# Patient Record
Sex: Male | Born: 1945 | Race: Black or African American | Hispanic: No | Marital: Married | Smoking: Former smoker
Health system: Southern US, Community
[De-identification: ages and names within clinical notes are randomized; demographics above are authoritative.]

## PROBLEM LIST (undated history)

## (undated) DIAGNOSIS — E119 Type 2 diabetes mellitus without complications: Secondary | ICD-10-CM

## (undated) HISTORY — PX: HEMORRHOID SURGERY: SHX153

## (undated) HISTORY — PX: BACK SURGERY: SHX140

---

## 2005-09-15 ENCOUNTER — Emergency Department (HOSPITAL_COMMUNITY): Admission: EM | Admit: 2005-09-15 | Discharge: 2005-09-15 | Payer: Self-pay | Admitting: Emergency Medicine

## 2006-03-22 ENCOUNTER — Ambulatory Visit (HOSPITAL_BASED_OUTPATIENT_CLINIC_OR_DEPARTMENT_OTHER): Admission: RE | Admit: 2006-03-22 | Discharge: 2006-03-22 | Payer: Self-pay | Admitting: Urology

## 2006-04-15 ENCOUNTER — Emergency Department (HOSPITAL_COMMUNITY): Admission: EM | Admit: 2006-04-15 | Discharge: 2006-04-16 | Payer: Self-pay | Admitting: Emergency Medicine

## 2006-05-13 ENCOUNTER — Emergency Department (HOSPITAL_COMMUNITY): Admission: EM | Admit: 2006-05-13 | Discharge: 2006-05-13 | Payer: Self-pay | Admitting: Emergency Medicine

## 2006-06-23 ENCOUNTER — Encounter: Admission: RE | Admit: 2006-06-23 | Discharge: 2006-06-23 | Payer: Self-pay | Admitting: Orthopedic Surgery

## 2009-03-06 ENCOUNTER — Ambulatory Visit: Payer: Self-pay | Admitting: Urology

## 2009-06-09 ENCOUNTER — Emergency Department (HOSPITAL_COMMUNITY): Admission: EM | Admit: 2009-06-09 | Discharge: 2009-06-10 | Payer: Self-pay | Admitting: Emergency Medicine

## 2010-07-07 LAB — GLUCOSE, CAPILLARY: Glucose-Capillary: 306 mg/dL — ABNORMAL HIGH (ref 70–99)

## 2010-09-03 NOTE — Op Note (Signed)
NAME:  Joshua Zavala, Joshua Zavala               ACCOUNT NO.:  [id number]   MEDICAL RECORD NO.:  [id number]          PATIENT TYPE:  AMB   LOCATION:  [facility]                         FACILITY:  [facility]   PHYSICIAN:  Maretta Bees. Vonita Moss, M.D.DATE OF BIRTH:  22-Aug-1945   DATE OF PROCEDURE:  03/22/2006  DATE OF DISCHARGE:                               OPERATIVE REPORT   PREOPERATIVE DIAGNOSIS:  Phimosis.   POSTOPERATIVE DIAGNOSIS:  Phimosis.   PROCEDURE:  Circumcision.   SURGEON:  Maretta Bees. Vonita Moss, M.D.   ANESTHESIA:  General.   INDICATIONS FOR PROCEDURE:  This 65 year old diabetic has had  progressive problems with penile inflammation and now he cannot draw his  foreskin back and he has a thickened fibrotic phimosis.  He is brought  to the OR today for circumcision.   DESCRIPTION OF PROCEDURE:  The patient is brought to the operating room  and he elected general anesthesia.  He was prepped and draped in the  usual fashion.  A circumcision was performed using the sleeve technique.  Hemostasis was obtained with the use of electrocautery and 4-0 chromic  catgut ties.  The frenular area was closed with running 4-0 chromic  catgut and then the distal edge of the penile skin was attached to the  residual edge of the mucosal foreskin at 3, 6, 9 and 12 o'clock with 4-0  chromic catgut.  Running 4-0 chromic catgut was placed between these  quadrant sutures.  I should add before closure, 0.25% Marcaine was  injected for local analgesia.  The wound was then dressed with Vaseline  gauze, dry sterile gauze, and Coban.  Blood loss was minimal.  Sponge,  needle, and instrument counts were correct.  The patient tolerated the  procedure well and was taken to the recovery room in good condition.      Maretta Bees. Vonita Moss, M.D.  Electronically Signed     LJP/MEDQ  D:  03/22/2006  T:  03/22/2006  Job:  [id number]

## 2012-07-30 ENCOUNTER — Encounter (HOSPITAL_COMMUNITY): Payer: Self-pay | Admitting: *Deleted

## 2012-07-30 ENCOUNTER — Inpatient Hospital Stay (HOSPITAL_COMMUNITY)
Admission: EM | Admit: 2012-07-30 | Discharge: 2012-08-03 | DRG: 690 | Disposition: A | Discharge status: Home / Self Care | Payer: Medicare Other | Attending: Internal Medicine | Admitting: Internal Medicine

## 2012-07-30 DIAGNOSIS — A498 Other bacterial infections of unspecified site: Secondary | ICD-10-CM | POA: Diagnosis present

## 2012-07-30 DIAGNOSIS — Z79899 Other long term (current) drug therapy: Secondary | ICD-10-CM

## 2012-07-30 DIAGNOSIS — Z91199 Patient's noncompliance with other medical treatment and regimen due to unspecified reason: Secondary | ICD-10-CM

## 2012-07-30 DIAGNOSIS — M549 Dorsalgia, unspecified: Secondary | ICD-10-CM | POA: Diagnosis present

## 2012-07-30 DIAGNOSIS — E119 Type 2 diabetes mellitus without complications: Secondary | ICD-10-CM

## 2012-07-30 DIAGNOSIS — Z9119 Patient's noncompliance with other medical treatment and regimen: Secondary | ICD-10-CM

## 2012-07-30 DIAGNOSIS — L03039 Cellulitis of unspecified toe: Secondary | ICD-10-CM | POA: Diagnosis present

## 2012-07-30 DIAGNOSIS — E1165 Type 2 diabetes mellitus with hyperglycemia: Secondary | ICD-10-CM | POA: Diagnosis present

## 2012-07-30 DIAGNOSIS — R739 Hyperglycemia, unspecified: Secondary | ICD-10-CM

## 2012-07-30 DIAGNOSIS — Z87891 Personal history of nicotine dependence: Secondary | ICD-10-CM

## 2012-07-30 DIAGNOSIS — N12 Tubulo-interstitial nephritis, not specified as acute or chronic: Principal | ICD-10-CM | POA: Diagnosis present

## 2012-07-30 DIAGNOSIS — E871 Hypo-osmolality and hyponatremia: Secondary | ICD-10-CM

## 2012-07-30 DIAGNOSIS — G8929 Other chronic pain: Secondary | ICD-10-CM | POA: Diagnosis present

## 2012-07-30 DIAGNOSIS — L02619 Cutaneous abscess of unspecified foot: Secondary | ICD-10-CM | POA: Diagnosis present

## 2012-07-30 DIAGNOSIS — L97509 Non-pressure chronic ulcer of other part of unspecified foot with unspecified severity: Secondary | ICD-10-CM | POA: Diagnosis present

## 2012-07-30 DIAGNOSIS — E236 Other disorders of pituitary gland: Secondary | ICD-10-CM | POA: Diagnosis present

## 2012-07-30 DIAGNOSIS — IMO0002 Reserved for concepts with insufficient information to code with codable children: Secondary | ICD-10-CM | POA: Diagnosis present

## 2012-07-30 HISTORY — DX: Type 2 diabetes mellitus without complications: E11.9

## 2012-07-30 NOTE — ED Notes (Signed)
Pt c/o back pain since Friday; not sleeping; no appetite; frequent urination

## 2012-07-31 ENCOUNTER — Observation Stay (HOSPITAL_COMMUNITY): Payer: Medicare Other

## 2012-07-31 ENCOUNTER — Encounter (HOSPITAL_COMMUNITY): Payer: Self-pay | Admitting: Internal Medicine

## 2012-07-31 DIAGNOSIS — E119 Type 2 diabetes mellitus without complications: Secondary | ICD-10-CM

## 2012-07-31 DIAGNOSIS — E871 Hypo-osmolality and hyponatremia: Secondary | ICD-10-CM | POA: Diagnosis present

## 2012-07-31 DIAGNOSIS — N12 Tubulo-interstitial nephritis, not specified as acute or chronic: Principal | ICD-10-CM | POA: Diagnosis present

## 2012-07-31 LAB — COMPREHENSIVE METABOLIC PANEL
ALT: 32 U/L (ref 0–53)
Albumin: 3.1 g/dL — ABNORMAL LOW (ref 3.5–5.2)
Alkaline Phosphatase: 81 U/L (ref 39–117)
BUN: 24 mg/dL — ABNORMAL HIGH (ref 6–23)
Chloride: 91 mEq/L — ABNORMAL LOW (ref 96–112)
GFR calc Af Amer: 74 mL/min — ABNORMAL LOW (ref >90)
GFR calc non Af Amer: 64 mL/min — ABNORMAL LOW (ref >90)
Glucose, Bld: 349 mg/dL — ABNORMAL HIGH (ref 70–99)
Total Protein: 7.5 g/dL (ref 6.0–8.3)

## 2012-07-31 LAB — CBC WITH DIFFERENTIAL/PLATELET
Basophils Relative: 0 % (ref 0–1)
Eosinophils Relative: 0 % (ref 0–5)
Lymphocytes Relative: 7 % — ABNORMAL LOW (ref 12–46)
Neutro Abs: 10.4 10*3/uL — ABNORMAL HIGH (ref 1.7–7.7)
Platelets: 217 10*3/uL (ref 150–400)
RDW: 14.1 % (ref 11.5–15.5)

## 2012-07-31 LAB — BASIC METABOLIC PANEL
BUN: 24 mg/dL — ABNORMAL HIGH (ref 6–23)
CO2: 24 mEq/L (ref 19–32)
Chloride: 94 mEq/L — ABNORMAL LOW (ref 96–112)
Creatinine, Ser: 1.02 mg/dL (ref 0.50–1.35)

## 2012-07-31 LAB — URINE MICROSCOPIC-ADD ON

## 2012-07-31 LAB — URINALYSIS, ROUTINE W REFLEX MICROSCOPIC
Glucose, UA: 500 mg/dL — AB
Ketones, ur: NEGATIVE mg/dL
Nitrite: NEGATIVE
Protein, ur: >300 mg/dL — AB
Specific Gravity, Urine: 1.03 (ref 1.005–1.030)
Specific Gravity, Urine: 1.026 (ref 1.005–1.030)
pH: 5 (ref 5.0–8.0)
pH: 5 (ref 5.0–8.0)

## 2012-07-31 LAB — CBC
HCT: 29.8 % — ABNORMAL LOW (ref 39.0–52.0)
MCHC: 34.6 g/dL (ref 30.0–36.0)
MCV: 69.6 fL — ABNORMAL LOW (ref 78.0–100.0)
RDW: 14.2 % (ref 11.5–15.5)

## 2012-07-31 LAB — GLUCOSE, CAPILLARY: Glucose-Capillary: 191 mg/dL — ABNORMAL HIGH (ref 70–99)

## 2012-07-31 MED ORDER — ACETAMINOPHEN 325 MG PO TABS
650.0000 mg | ORAL_TABLET | Freq: Four times a day (QID) | ORAL | Status: DC | PRN
  Administered 2012-07-31: 650 mg via ORAL
  Filled 2012-07-31: qty 2

## 2012-07-31 MED ORDER — SODIUM CHLORIDE 0.9 % IV BOLUS (SEPSIS)
1000.0000 mL | Freq: Once | INTRAVENOUS | Status: AC
  Administered 2012-07-31: 1000 mL via INTRAVENOUS

## 2012-07-31 MED ORDER — ONDANSETRON HCL 4 MG/2ML IJ SOLN
4.0000 mg | Freq: Once | INTRAMUSCULAR | Status: AC
  Administered 2012-07-31: 4 mg via INTRAVENOUS
  Filled 2012-07-31: qty 2

## 2012-07-31 MED ORDER — SODIUM CHLORIDE 0.9 % IV SOLN
INTRAVENOUS | Status: DC
  Administered 2012-07-31 – 2012-08-01 (×3): via INTRAVENOUS

## 2012-07-31 MED ORDER — ONDANSETRON HCL 4 MG/2ML IJ SOLN: 4.0000 mg | Freq: Four times a day (QID) | INTRAMUSCULAR | Status: DC | PRN

## 2012-07-31 MED ORDER — INSULIN ASPART 100 UNIT/ML ~~LOC~~ SOLN
0.0000 [IU] | Freq: Three times a day (TID) | SUBCUTANEOUS | Status: DC
  Administered 2012-07-31: 3 [IU] via SUBCUTANEOUS
  Administered 2012-07-31: 2 [IU] via SUBCUTANEOUS
  Administered 2012-07-31: 3 [IU] via SUBCUTANEOUS
  Administered 2012-08-01: 2 [IU] via SUBCUTANEOUS
  Administered 2012-08-01: 1 [IU] via SUBCUTANEOUS
  Administered 2012-08-02: 2 [IU] via SUBCUTANEOUS
  Administered 2012-08-02 – 2012-08-03 (×2): 1 [IU] via SUBCUTANEOUS

## 2012-07-31 MED ORDER — ACETAMINOPHEN 650 MG RE SUPP: 650.0000 mg | Freq: Four times a day (QID) | RECTAL | Status: DC | PRN

## 2012-07-31 MED ORDER — DEXTROSE 5 % IV SOLN
1.0000 g | Freq: Once | INTRAVENOUS | Status: AC
  Administered 2012-07-31: 1 g via INTRAVENOUS
  Filled 2012-07-31: qty 10

## 2012-07-31 MED ORDER — SODIUM CHLORIDE 0.9 % IJ SOLN
3.0000 mL | Freq: Two times a day (BID) | INTRAMUSCULAR | Status: DC
  Administered 2012-08-02 – 2012-08-03 (×3): 3 mL via INTRAVENOUS

## 2012-07-31 MED ORDER — ENOXAPARIN SODIUM 40 MG/0.4ML ~~LOC~~ SOLN
40.0000 mg | SUBCUTANEOUS | Status: DC
  Administered 2012-07-31 – 2012-08-01 (×2): 40 mg via SUBCUTANEOUS
  Filled 2012-07-31 (×4): qty 0.4

## 2012-07-31 MED ORDER — ONDANSETRON HCL 4 MG PO TABS: 4.0000 mg | ORAL_TABLET | Freq: Four times a day (QID) | ORAL | Status: DC | PRN

## 2012-07-31 MED ORDER — DEXTROSE 5 % IV SOLN
1.0000 g | INTRAVENOUS | Status: DC
  Administered 2012-08-01 – 2012-08-03 (×3): 1 g via INTRAVENOUS
  Filled 2012-07-31 (×3): qty 10

## 2012-07-31 MED ORDER — INSULIN GLARGINE 100 UNIT/ML ~~LOC~~ SOLN
15.0000 [IU] | Freq: Every day | SUBCUTANEOUS | Status: DC
  Administered 2012-07-31 – 2012-08-01 (×2): 15 [IU] via SUBCUTANEOUS
  Filled 2012-07-31 (×2): qty 0.15

## 2012-07-31 MED ORDER — MORPHINE SULFATE 4 MG/ML IJ SOLN
4.0000 mg | Freq: Once | INTRAMUSCULAR | Status: AC
  Administered 2012-07-31: 4 mg via INTRAVENOUS
  Filled 2012-07-31: qty 1

## 2012-07-31 MED ORDER — HYDROCODONE-ACETAMINOPHEN 5-325 MG PO TABS
1.0000 | ORAL_TABLET | ORAL | Status: DC | PRN
  Administered 2012-07-31 – 2012-08-03 (×13): 1 via ORAL
  Filled 2012-07-31 (×13): qty 1

## 2012-07-31 MED ORDER — SODIUM CHLORIDE 0.9 % IV SOLN: INTRAVENOUS | Status: DC

## 2012-07-31 NOTE — H&P (Signed)
[facility] History and Physical  Joshua Zavala ZOX:[id number] DOB: Aug 22, 1945 DOA: 07/30/2012  Referring physician: ER physician. PCP: No PCP Per Patient Joshua Zavala. Specialists: None.  Chief Complaint: Fever chills and left flank pain.  HPI: Joshua Zavala is a 67 y.o. male history of diabetes mellitus type 2 has been experiencing left flank pain with fever and chills over the last 3 days which has been gradually worsening. Denies any trauma or fall. Denies any nausea vomiting or diarrhea. Date ER patient was found to be febrile with UA compatible with UTI. At this time urine cultures have been sent CT abdomen and pelvis has been ordered to rule out any obstruction and patient will be admitted for further management of his pyelonephritis. Patient otherwise denies any chest pain or shortness of breath dizziness or loss of consciousness.   Review of Systems: As presented in the history of presenting illness, rest negative.  Past Medical History  Diagnosis Date  . Diabetes mellitus without complication    Past Surgical History  Procedure Laterality Date  . Back surgery    . Hemorrhoid surgery     Social History:  reports that he has quit smoking. He does not have any smokeless tobacco history on file. He reports that he does not drink alcohol or use illicit drugs. Lives at home. where does patient live-- Can do ADLs. Can patient participate in ADLs?  No Known Allergies  Family History  Problem Relation Age of Onset  . Other Neg Hx       Prior to Admission medications   Medication Sig Start Date End Date Taking? Authorizing Provider  metFORMIN (GLUCOPHAGE) 500 MG tablet Take 500 mg by mouth 2 (two) times daily with a meal.   Yes Historical Provider, MD   Physical Exam: Filed Vitals:   07/30/12 2348 07/30/12 2350  BP:  156/86  Pulse: 114   Temp: 100.3 F (37.9 C)   Resp: 20   SpO2: 99%      General:  Well-developed well-nourished.  Eyes: Anicteric  no pallor.  ENT: No discharge from the ears eyes nose and mouth.  Neck: No mass felt.  Cardiovascular: S1-S2 heard.  Respiratory: No rhonchi or crepitations.  Abdomen: Soft mild tenderness in the left flank area. No guarding or rigidity.  Skin: No rash.  Musculoskeletal: No edema.  Psychiatric: Appears normal.  Neurologic: Alert awake oriented to time place and person. Moves all extremities.  Labs on Admission:  Basic Metabolic Panel:  Recent Labs Lab 07/31/12 0225  NA 129*  K 3.7  CL 91*  CO2 22  GLUCOSE 349*  BUN 24*  CREATININE 1.14  CALCIUM 8.6   Liver Function Tests:  Recent Labs Lab 07/31/12 0225  AST 35  ALT 32  ALKPHOS 81  BILITOT 1.1  PROT 7.5  ALBUMIN 3.1*   No results found for this basename: LIPASE, AMYLASE,  in the last 168 hours No results found for this basename: AMMONIA,  in the last 168 hours CBC:  Recent Labs Lab 07/31/12 0225  WBC 12.2*  NEUTROABS 10.4*  HGB 10.9*  HCT 31.0*  MCV 69.5*  PLT 217   Cardiac Enzymes: No results found for this basename: CKTOTAL, CKMB, CKMBINDEX, TROPONINI,  in the last 168 hours  BNP (last 3 results) No results found for this basename: PROBNP,  in the last 8760 hours CBG: No results found for this basename: GLUCAP,  in the last 168 hours  Radiological Exams on Admission: No results found.  Assessment/Plan Principal Problem:   Pyelonephritis Active Problems:   Diabetes mellitus   1. Pyelonephritis - at this time CT abdomen and pelvis is pending to check for any hydronephrosis and if present will need urology consult. Continue with IV ceftriaxone and follow urine cultures. Continue IV fluids. 2. Uncontrolled diabetes mellitus type 2 - check hemoglobin A1c. Patient's medication list is not complete and patient does not recall exactly what he takes at home. At this time we will continue hydration and have placed patient on sliding-scale coverage. 3. Hyponatremia probably from dehydration and  uncontrolled diabetes - continue with hydration and recheck metabolic panel. Closely follow intake output.    Code Status: Full code.  Family Communication: None.  Disposition Plan: Admit to inpatient.    Joshua Zavala N. [facility] Pager [phone].  If 7PM-7AM, please contact night-coverage [email or web address] Password [email or web address] 07/31/2012, 4:41 AM

## 2012-07-31 NOTE — Progress Notes (Signed)
Inpatient Diabetes Program Recommendations  AACE/ADA: New Consensus Statement on Inpatient Glycemic Control (2013)  Target Ranges:  Prepandial:   less than 140 mg/dL      Peak postprandial:   less than 180 mg/dL (1-2 hours)      Critically ill patients:  140 - 180 mg/dL     Results for KOUA, DEEG (MRN [id number]) as of 07/31/2012 16:24  Ref. Range 07/31/2012 06:35  Hemoglobin A1C Latest Range: <5.7 % 11.4 (H)    Patient with elevated A1c.  Attempted to speak with patient about his A1c results.  Patient was very quiet and did not appear that he wanted to interact with me.  When I questioned him about pain, patient told me he was having pain but had not asked the RN for anything to alleviate his pain.    Noted MD had started patient on Lantus and Novolog today.  Spoke with patient about his elevated A1c and attempted to explain the significance of his A1c.  Patient told me he thought his last A1c was 4.1% (unsure if this is accurate).  Patient sees Dr. Orson Aloe at the [facility].  Per patient, he last saw his PCP 3 weeks ago before admission. Patient told me he also takes Glipizide at home but was unsure of the dose.  Unsure at this point, if patient is even taking his PO DM medications at home or not.  MD- Do you think you will d/c patient home on insulin?  Patient will need education on insulin administration if you choose to send him home on insulin.  Vial and syringe may be the cheapest option for him as he has Medicare coverage.  Will follow. Ambrose Finland RN, MSN, CDE Diabetes Coordinator Inpatient Diabetes Program [phone]

## 2012-07-31 NOTE — Progress Notes (Signed)
[facility]                                                                                Patient Demographics  Joshua Zavala, is a 67 y.o. male, DOB - 1945-07-23, ZOX:[id number], WJX:[id number]  Admit date - 07/30/2012  Admitting Physician Eduard Clos, MD  Outpatient Primary MD for the patient is No PCP Per Patient  LOS - 1   Chief Complaint  Patient presents with  . Back Pain        Assessment & Plan    1. L.Flank Pain due to L.  Pyelonephritis - already improved on empiric Rocephin which will be continued, will follow urine cultures, blood cultures have been ordered if temperature is over 100. Will need outpatient followup for repeat UA as > 300 protein was noted on UA. Also outpatient followup with urology post discharge for bladder thickening.   2. Hyponatremia likely due to dehydration, improved with IV fluids which will be continued, urine and serum osmolality, urine sodium will be checked.   3. DM type II. On sliding scale insulin, will add low-dose long-acting insulin, Glucophage held, check A1c follow CBGs.   Lab Results  Component Value Date   HGBA1C 11.4* 07/31/2012    CBG (last 3)   Recent Labs  07/31/12 0800  GLUCAP 231*       Code Status:Full  Family Communication:   Disposition Plan:Home   Procedures CT Abd-Pelvis   Consults      DVT Prophylaxis  Lovenox   Lab Results  Component Value Date   PLT 196 07/31/2012    Medications  Scheduled Meds: . [START ON 08/01/2012] cefTRIAXone (ROCEPHIN)  IV  1 g Intravenous Q24H  . enoxaparin (LOVENOX) injection  40 mg Subcutaneous Q24H  . insulin aspart  0-9 Units Subcutaneous TID WC  . sodium chloride  3 mL Intravenous Q12H   Continuous Infusions: . sodium chloride     PRN Meds:.acetaminophen, acetaminophen, ondansetron (ZOFRAN) IV, ondansetron  Antibiotics    Anti-infectives   Start     Dose/Rate Route Frequency Ordered Stop   08/01/12 0200  cefTRIAXone  (ROCEPHIN) 1 g in dextrose 5 % 50 mL IVPB     1 g 100 mL/hr over 30 Minutes Intravenous Every 24 hours 07/31/12 0551     07/31/12 0345  cefTRIAXone (ROCEPHIN) 1 g in dextrose 5 % 50 mL IVPB     1 g 100 mL/hr over 30 Minutes Intravenous  Once 07/31/12 0331 07/31/12 0444       Time Spent in minutes  35   Joshua Zavala M.D on 07/31/2012 at 11:36 AM  Between 7am to 7pm - Pager - [phone]  After 7pm go to [email or web address] - password [email or web address]  And look for the night coverage person covering for me after hours  [facility] Office  [phone]    Subjective:   Joshua Zavala today has, No headache, No chest pain, No abdominal pain - No Nausea, No new weakness tingling or numbness, No Cough - SOB. Improved L.Flank pain.  Objective:   Filed Vitals:   07/30/12 2348 07/30/12 2350 07/31/12 0504 07/31/12 0535  BP:  156/86 128/64 145/67  Pulse:  114  98 104  Temp: 100.3 F (37.9 C)  99.4 F (37.4 C) 98.9 F (37.2 C)  TempSrc:   Oral Oral  Resp: 20  17 18   Height:    5\' 11"  (1.803 m)  Weight:    93 kg (205 lb 0.4 oz)  SpO2: 99%  94% 98%    Wt Readings from Last 3 Encounters:  07/31/12 93 kg (205 lb 0.4 oz)     Intake/Output Summary (Last 24 hours) at 07/31/12 1136 Last data filed at 07/31/12 0800  Gross per 24 hour  Intake    125 ml  Output    150 ml  Net    -25 ml    Exam Awake Alert, Oriented X 3, No new F.N deficits, Normal affect [place].AT,PERRAL Supple Neck,No JVD, No cervical lymphadenopathy appriciated.  Symmetrical Chest wall movement, Good air movement bilaterally, CTAB RRR,No Gallops,Rubs or new Murmurs, No Parasternal Heave +ve B.Sounds, Abd Soft, Non tender, No organomegaly appriciated, No rebound - guarding or rigidity. Mild L flank tenderness No Cyanosis, Clubbing or edema, No new Rash or bruise    Data Review   Micro Results No results found for this or any previous visit (from the past 240 hour(s)).  Radiology Reports Ct Abdomen Pelvis  Wo Contrast  07/31/2012  *RADIOLOGY REPORT*  Clinical Data: Bilateral flank pain and increased urinary frequency.  Red blood cells and white blood cells in the urine. Mild leukocytosis.  CT ABDOMEN AND PELVIS WITHOUT CONTRAST  Technique:  Multidetector CT imaging of the abdomen and pelvis was performed following the standard protocol without intravenous contrast.  Comparison: Lumbar spine radiographs performed 09/15/2005  Findings: Mild bibasilar atelectasis is noted, slightly nodular in appearance.  The liver and spleen are unremarkable in appearance.  The gallbladder is within normal limits.  The pancreas and adrenal glands are unremarkable.  Nonspecific perinephric stranding is noted bilaterally.  Evaluation for pyelonephritis is limited without contrast, but mild pyelonephritis cannot be excluded.  There is no evidence of hydronephrosis.  No renal or ureteral stones are seen.  No free fluid is identified.  The small bowel is unremarkable in appearance.  The stomach is within normal limits.  No acute vascular abnormalities are seen.  Scattered calcification is noted along the abdominal aorta and its branches.  The appendix is normal in caliber, without evidence for appendicitis.  The colon is unremarkable in appearance.  The bladder is mildly distended; mild apparent anterior bladder wall thickening is nonspecific.  Though this may reflect decompression, malignancy cannot be entirely excluded.  The prostate remains borderline normal in size, with scattered calcification.  No inguinal lymphadenopathy is seen.  No acute osseous abnormalities are identified.  Vacuum phenomenon is noted at L4-L5 and L5-S1.  IMPRESSION:  1.  No evidence of hydronephrosis; no renal or ureteral stones seen. 2.  Mild apparent anterior bladder wall thickening is nonspecific. Though this may reflect decompression, malignancy cannot be entirely excluded.  Cystoscopy would be helpful for further evaluation after treatment of the acute  infectious process. 3.  Nonspecific perinephric stranding noted bilaterally. Evaluation for pyelonephritis is limited without contrast, but mild pyelonephritis cannot be excluded, given the patient's symptoms.   Original Report Authenticated By: Tonia Ghent, M.D.     CBC  Recent Labs Lab 07/31/12 0225 07/31/12 0635  WBC 12.2* 11.1*  HGB 10.9* 10.3*  HCT 31.0* 29.8*  PLT 217 196  MCV 69.5* 69.6*  MCH 24.4* 24.1*  MCHC 35.2 34.6  RDW 14.1 14.2  LYMPHSABS  0.9  --   MONOABS 0.9  --   EOSABS 0.0  --   BASOSABS 0.0  --     Chemistries   Recent Labs Lab 07/31/12 0225 07/31/12 0635  NA 129* 130*  Zavala 3.7 3.6  CL 91* 94*  CO2 22 24  GLUCOSE 349* 268*  BUN 24* 24*  CREATININE 1.14 1.02  CALCIUM 8.6 8.1*  AST 35  --   ALT 32  --   ALKPHOS 81  --   BILITOT 1.1  --    ------------------------------------------------------------------------------------------------------------------ estimated creatinine clearance is 80.8 ml/min (by C-G formula based on Cr of 1.02). ------------------------------------------------------------------------------------------------------------------  Recent Labs  07/31/12 0635  HGBA1C 11.4*   ------------------------------------------------------------------------------------------------------------------ No results found for this basename: CHOL, HDL, LDLCALC, TRIG, CHOLHDL, LDLDIRECT,  in the last 72 hours ------------------------------------------------------------------------------------------------------------------ No results found for this basename: TSH, T4TOTAL, FREET3, T3FREE, THYROIDAB,  in the last 72 hours ------------------------------------------------------------------------------------------------------------------ No results found for this basename: VITAMINB12, FOLATE, FERRITIN, TIBC, IRON, RETICCTPCT,  in the last 72 hours  Coagulation profile No results found for this basename: INR, PROTIME,  in the last 168 hours  No  results found for this basename: DDIMER,  in the last 72 hours  Cardiac Enzymes No results found for this basename: CK, CKMB, TROPONINI, MYOGLOBIN,  in the last 168 hours ------------------------------------------------------------------------------------------------------------------ No components found with this basename: POCBNP,

## 2012-07-31 NOTE — ED Provider Notes (Signed)
History     CSN: [id number]  Arrival date & time 07/30/12  2302   First MD Initiated Contact with Patient 07/31/12 0300      Chief Complaint  Patient presents with  . Back Pain   HPI   history provided by the patient. Patient is a 67 year old male with history of diabetes who follows at the [place] presenting with complaints of bilateral back and flank pain. Symptoms are associated with decreased appetite, fever and chills. Patient also mentions some increased urinary frequency. Denies any dysuria, hematuria, or urgency. Patient also denies any associated episodes of vomiting, diarrhea or constipation. He has not used any medications for treatment. Denies any other aggravating or alleviating factors. No other associated symptoms.    Past Medical History  Diagnosis Date  . Diabetes mellitus without complication     History reviewed. No pertinent past surgical history.  No family history on file.  History  Substance Use Topics  . Smoking status: Never Smoker   . Smokeless tobacco: Not on file  . Alcohol Use: No      Review of Systems  Constitutional: Positive for fever, chills and appetite change.  Gastrointestinal: Positive for nausea. Negative for vomiting, abdominal pain, diarrhea and constipation.  Genitourinary: Positive for flank pain. Negative for dysuria, frequency and hematuria.  Musculoskeletal: Positive for back pain.  All other systems reviewed and are negative.    Allergies  Review of patient's allergies indicates no known allergies.  Home Medications   Current Outpatient Rx  Name  Route  Sig  Dispense  Refill  . metFORMIN (GLUCOPHAGE) 500 MG tablet   Oral   Take 500 mg by mouth 2 (two) times daily with a meal.           BP 156/86  Pulse 114  Temp(Src) 100.3 F (37.9 C)  Resp 20  SpO2 99%  Physical Exam  Nursing note and vitals reviewed. Constitutional: He is oriented to person, place, and time. He appears well-developed and well-nourished.  No distress.  HENT:  Head: Normocephalic.  Cardiovascular: Regular rhythm.  Tachycardia present.   Pulmonary/Chest: Effort normal and breath sounds normal. No respiratory distress. He has no wheezes. He has no rales.  Abdominal: Soft. There is CVA tenderness. There is no rebound, no guarding and no tenderness at McBurney's point.  Genitourinary: Rectum normal, prostate normal and penis normal.  Normal genital exam. Normal prostate without tenderness.  Musculoskeletal: Normal range of motion.  Neurological: He is alert and oriented to person, place, and time.  Skin: Skin is warm.  Psychiatric: He has a normal mood and affect. His behavior is normal.    ED Course  Procedures   Results for orders placed during the hospital encounter of 07/30/12  URINALYSIS, ROUTINE W REFLEX MICROSCOPIC      Result Value Range   Color, Urine ORANGE (*) YELLOW   APPearance TURBID (*) CLEAR   Specific Gravity, Urine 1.030  1.005 - 1.030   pH 5.0  5.0 - 8.0   Glucose, UA 500 (*) NEGATIVE mg/dL   Hgb urine dipstick MODERATE (*) NEGATIVE   Bilirubin Urine SMALL (*) NEGATIVE   Ketones, ur NEGATIVE  NEGATIVE mg/dL   Protein, ur >409 (*) NEGATIVE mg/dL   Urobilinogen, UA 4.0 (*) 0.0 - 1.0 mg/dL   Nitrite NEGATIVE  NEGATIVE   Leukocytes, UA LARGE (*) NEGATIVE  CBC WITH DIFFERENTIAL      Result Value Range   WBC 12.2 (*) 4.0 - 10.5 K/uL   RBC  4.46  4.22 - 5.81 MIL/uL   Hemoglobin 10.9 (*) 13.0 - 17.0 g/dL   HCT 16.1 (*) 09.6 - 04.5 %   MCV 69.5 (*) 78.0 - 100.0 fL   MCH 24.4 (*) 26.0 - 34.0 pg   MCHC 35.2  30.0 - 36.0 g/dL   RDW 40.9  81.1 - 91.4 %   Platelets 217  150 - 400 K/uL   Neutrophils Relative 86 (*) 43 - 77 %   Lymphocytes Relative 7 (*) 12 - 46 %   Monocytes Relative 7  3 - 12 %   Eosinophils Relative 0  0 - 5 %   Basophils Relative 0  0 - 1 %   Neutro Abs 10.4 (*) 1.7 - 7.7 K/uL   Lymphs Abs 0.9  0.7 - 4.0 K/uL   Monocytes Absolute 0.9  0.1 - 1.0 K/uL   Eosinophils Absolute 0.0  0.0 -  0.7 K/uL   Basophils Absolute 0.0  0.0 - 0.1 K/uL   RBC Morphology STOMATOCYTES    COMPREHENSIVE METABOLIC PANEL      Result Value Range   Sodium 129 (*) 135 - 145 mEq/L   Potassium 3.7  3.5 - 5.1 mEq/L   Chloride 91 (*) 96 - 112 mEq/L   CO2 22  19 - 32 mEq/L   Glucose, Bld 349 (*) 70 - 99 mg/dL   BUN 24 (*) 6 - 23 mg/dL   Creatinine, Ser 7.82  0.50 - 1.35 mg/dL   Calcium 8.6  8.4 - 95.6 mg/dL   Total Protein 7.5  6.0 - 8.3 g/dL   Albumin 3.1 (*) 3.5 - 5.2 g/dL   AST 35  0 - 37 U/L   ALT 32  0 - 53 U/L   Alkaline Phosphatase 81  39 - 117 U/L   Total Bilirubin 1.1  0.3 - 1.2 mg/dL   GFR calc non Af Amer 64 (*) >90 mL/min   GFR calc Af Amer 74 (*) >90 mL/min  URINE MICROSCOPIC-ADD ON      Result Value Range   Squamous Epithelial / LPF FEW (*) RARE   WBC, UA 21-50  <3 WBC/hpf   RBC / HPF 11-20  <3 RBC/hpf   Bacteria, UA MANY (*) RARE   Casts GRANULAR CAST (*) NEGATIVE       No results found.   1. Pyelonephritis   2. Hyperglycemia       MDM  Patient seen and evaluated. Patient resting appears comfortable in no acute distress. Patient with bilateral flank and CVA tenderness. Unremarkable abdominal exam. Normal prostate. Normal genital exam. UA concerning for UTI and symptoms for pyelonephritis. Rocephin ordered.  Spoke with [facility]. They will see patient and admit. Would like a CT scan to evaluate for hydronephrosis. Will place patient on telemetry bed under team 8.      Angus Seller, PA-C 07/31/12 [phone]

## 2012-07-31 NOTE — ED Provider Notes (Signed)
Medical screening examination/treatment/procedure(s) were performed by non-physician practitioner and as supervising physician I was immediately available for consultation/collaboration.    Celene Kras, MD 07/31/12 [phone]

## 2012-08-01 DIAGNOSIS — E871 Hypo-osmolality and hyponatremia: Secondary | ICD-10-CM

## 2012-08-01 LAB — CBC
HCT: 27.6 % — ABNORMAL LOW (ref 39.0–52.0)
Hemoglobin: 9.6 g/dL — ABNORMAL LOW (ref 13.0–17.0)
MCHC: 34.8 g/dL (ref 30.0–36.0)

## 2012-08-01 LAB — BASIC METABOLIC PANEL
BUN: 22 mg/dL (ref 6–23)
CO2: 26 mEq/L (ref 19–32)
Calcium: 8.2 mg/dL — ABNORMAL LOW (ref 8.4–10.5)
Glucose, Bld: 179 mg/dL — ABNORMAL HIGH (ref 70–99)
Sodium: 134 mEq/L — ABNORMAL LOW (ref 135–145)

## 2012-08-01 LAB — OSMOLALITY, URINE: Osmolality, Ur: 569 mOsm/kg (ref 390–1090)

## 2012-08-01 MED ORDER — BD GETTING STARTED TAKE HOME KIT: 1/2ML X 30G SYRINGES
1.0000 | Freq: Once | Status: AC
  Administered 2012-08-01: 1
  Filled 2012-08-01: qty 1

## 2012-08-01 MED ORDER — INSULIN GLARGINE 100 UNIT/ML ~~LOC~~ SOLN
20.0000 [IU] | Freq: Every day | SUBCUTANEOUS | Status: DC
  Administered 2012-08-02 – 2012-08-03 (×2): 20 [IU] via SUBCUTANEOUS
  Filled 2012-08-01 (×2): qty 0.2

## 2012-08-01 MED ORDER — INSULIN GLARGINE 100 UNIT/ML ~~LOC~~ SOLN
5.0000 [IU] | Freq: Once | SUBCUTANEOUS | Status: AC
  Administered 2012-08-01: 5 [IU] via SUBCUTANEOUS
  Filled 2012-08-01: qty 0.05

## 2012-08-01 MED ORDER — LISINOPRIL 2.5 MG PO TABS
2.5000 mg | ORAL_TABLET | Freq: Every day | ORAL | Status: DC
  Administered 2012-08-01 – 2012-08-03 (×4): 2.5 mg via ORAL
  Filled 2012-08-01 (×4): qty 1

## 2012-08-01 MED ORDER — SODIUM CHLORIDE 0.9 % IV SOLN
INTRAVENOUS | Status: DC
  Administered 2012-08-01 – 2012-08-02 (×3): via INTRAVENOUS

## 2012-08-01 NOTE — Progress Notes (Signed)
Pt states that his has a PCP; Dr. Nada Boozer [address] [place], [place] [address]. [phone].

## 2012-08-01 NOTE — Progress Notes (Signed)
[facility]                                                                                Patient Demographics  Joshua Zavala, is a 67 y.o. male, DOB - 1945/10/02, ZOX:[id number], WJX:[id number]  Admit date - 07/30/2012  Admitting Physician Eduard Clos, MD  Outpatient Primary MD for the patient is No PCP Per Patient  LOS - 2   Chief Complaint  Patient presents with  . Back Pain        Assessment & Plan    1. L.Flank Pain due to L.  Pyelonephritis - already improved on empiric Rocephin which will be continued, will follow urine cultures, blood cultures have been ordered if temperature is over 100. Will need outpatient followup for repeat UA as > 300 protein was noted on UA. Also outpatient followup with urology post discharge for bladder thickening.   2. Hyponatremia likely due to dehydration, improved with IV fluids which will be continued.   3. DM type II. On sliding scale insulin, will add low-dose long-acting insulin, Glucophage held, check A1c follow CBGs. DM and Insulin education.   Lab Results  Component Value Date   HGBA1C 11.4* 07/31/2012    CBG (last 3)   Recent Labs  07/31/12 1713 07/31/12 2144 08/01/12 0744  GLUCAP 236* 191* 190*       Code Status:Full  Family Communication:   Disposition Plan:Home   Procedures CT Abd-Pelvis   Consults      DVT Prophylaxis  Lovenox   Lab Results  Component Value Date   PLT 211 08/01/2012    Medications  Scheduled Meds: . cefTRIAXone (ROCEPHIN)  IV  1 g Intravenous Q24H  . enoxaparin (LOVENOX) injection  40 mg Subcutaneous Q24H  . insulin aspart  0-9 Units Subcutaneous TID WC  . insulin glargine  15 Units Subcutaneous Daily  . sodium chloride  3 mL Intravenous Q12H   Continuous Infusions:   PRN Meds:.acetaminophen, acetaminophen, HYDROcodone-acetaminophen, ondansetron (ZOFRAN) IV, ondansetron  Antibiotics    Anti-infectives   Start     Dose/Rate Route Frequency  Ordered Stop   08/01/12 0200  cefTRIAXone (ROCEPHIN) 1 g in dextrose 5 % 50 mL IVPB     1 g 100 mL/hr over 30 Minutes Intravenous Every 24 hours 07/31/12 0551     07/31/12 0345  cefTRIAXone (ROCEPHIN) 1 g in dextrose 5 % 50 mL IVPB     1 g 100 mL/hr over 30 Minutes Intravenous  Once 07/31/12 0331 07/31/12 0444       Time Spent in minutes  35   Namya Voges K M.D on 08/01/2012 at 12:09 PM  Between 7am to 7pm - Pager - [phone]  After 7pm go to [email or web address] - password [email or web address]  And look for the night coverage person covering for me after hours  [facility] Office  [phone]    Subjective:   [name] today has, No headache, No chest pain, No abdominal pain - No Nausea, No new weakness tingling or numbness, No Cough - SOB. Improved L.Flank pain.  Objective:   Filed Vitals:   07/31/12 0535 07/31/12 1330 07/31/12 2146 08/01/12 0540  BP: 145/67 129/70 116/67  131/60  Pulse: 104 97 86 91  Temp: 98.9 F (37.2 C) 98.9 F (37.2 C) 97.7 F (36.5 C) 98.6 F (37 C)  TempSrc: Oral Oral Oral Oral  Resp: 18 19 18 18   Height: 5\' 11"  (1.803 m)     Weight: 93 kg (205 lb 0.4 oz)   92.9 kg (204 lb 12.9 oz)  SpO2: 98% 99% 100% 98%    Wt Readings from Last 3 Encounters:  08/01/12 92.9 kg (204 lb 12.9 oz)     Intake/Output Summary (Last 24 hours) at 08/01/12 1209 Last data filed at 08/01/12 0900  Gross per 24 hour  Intake   2500 ml  Output    775 ml  Net   1725 ml    Exam Awake Alert, Oriented X 3, No new F.N deficits, Normal affect [place].AT,PERRAL Supple Neck,No JVD, No cervical lymphadenopathy appriciated.  Symmetrical Chest wall movement, Good air movement bilaterally, CTAB RRR,No Gallops,Rubs or new Murmurs, No Parasternal Heave +ve B.Sounds, Abd Soft, Non tender, No organomegaly appriciated, No rebound - guarding or rigidity. Mild L flank tenderness No Cyanosis, Clubbing or edema, No new Rash or bruise    Data Review   Micro Results No results  found for this or any previous visit (from the past 240 hour(s)).  Radiology Reports Ct Abdomen Pelvis Wo Contrast  07/31/2012  *RADIOLOGY REPORT*  Clinical Data: Bilateral flank pain and increased urinary frequency.  Red blood cells and white blood cells in the urine. Mild leukocytosis.  CT ABDOMEN AND PELVIS WITHOUT CONTRAST  Technique:  Multidetector CT imaging of the abdomen and pelvis was performed following the standard protocol without intravenous contrast.  Comparison: Lumbar spine radiographs performed 09/15/2005  Findings: Mild bibasilar atelectasis is noted, slightly nodular in appearance.  The liver and spleen are unremarkable in appearance.  The gallbladder is within normal limits.  The pancreas and adrenal glands are unremarkable.  Nonspecific perinephric stranding is noted bilaterally.  Evaluation for pyelonephritis is limited without contrast, but mild pyelonephritis cannot be excluded.  There is no evidence of hydronephrosis.  No renal or ureteral stones are seen.  No free fluid is identified.  The small bowel is unremarkable in appearance.  The stomach is within normal limits.  No acute vascular abnormalities are seen.  Scattered calcification is noted along the abdominal aorta and its branches.  The appendix is normal in caliber, without evidence for appendicitis.  The colon is unremarkable in appearance.  The bladder is mildly distended; mild apparent anterior bladder wall thickening is nonspecific.  Though this may reflect decompression, malignancy cannot be entirely excluded.  The prostate remains borderline normal in size, with scattered calcification.  No inguinal lymphadenopathy is seen.  No acute osseous abnormalities are identified.  Vacuum phenomenon is noted at L4-L5 and L5-S1.  IMPRESSION:  1.  No evidence of hydronephrosis; no renal or ureteral stones seen. 2.  Mild apparent anterior bladder wall thickening is nonspecific. Though this may reflect decompression, malignancy cannot be  entirely excluded.  Cystoscopy would be helpful for further evaluation after treatment of the acute infectious process. 3.  Nonspecific perinephric stranding noted bilaterally. Evaluation for pyelonephritis is limited without contrast, but mild pyelonephritis cannot be excluded, given the patient's symptoms.   Original Report Authenticated By: Tonia Ghent, M.D.     [facility]  Recent Labs Lab 07/31/12 0225 07/31/12 0635 08/01/12 0414  WBC 12.2* 11.1* 10.3  HGB 10.9* 10.3* 9.6*  HCT 31.0* 29.8* 27.6*  PLT 217 196 211  MCV 69.5*  69.6* 70.1*  MCH 24.4* 24.1* 24.4*  MCHC 35.2 34.6 34.8  RDW 14.1 14.2 14.2  LYMPHSABS 0.9  --   --   MONOABS 0.9  --   --   EOSABS 0.0  --   --   BASOSABS 0.0  --   --     Chemistries   Recent Labs Lab 07/31/12 0225 07/31/12 0635 08/01/12 0414  NA 129* 130* 134*  K 3.7 3.6 3.5  CL 91* 94* 98  CO2 22 24 26   GLUCOSE 349* 268* 179*  BUN 24* 24* 22  CREATININE 1.14 1.02 0.85  CALCIUM 8.6 8.1* 8.2*  AST 35  --   --   ALT 32  --   --   ALKPHOS 81  --   --   BILITOT 1.1  --   --    ------------------------------------------------------------------------------------------------------------------ estimated creatinine clearance is 96.8 ml/min (by C-G formula based on Cr of 0.85). ------------------------------------------------------------------------------------------------------------------  Recent Labs  07/31/12 0635  HGBA1C 11.4*   ------------------------------------------------------------------------------------------------------------------ No results found for this basename: CHOL, HDL, LDLCALC, TRIG, CHOLHDL, LDLDIRECT,  in the last 72 hours ------------------------------------------------------------------------------------------------------------------ No results found for this basename: TSH, T4TOTAL, FREET3, T3FREE, THYROIDAB,  in the last 72  hours ------------------------------------------------------------------------------------------------------------------ No results found for this basename: VITAMINB12, FOLATE, FERRITIN, TIBC, IRON, RETICCTPCT,  in the last 72 hours  Coagulation profile No results found for this basename: INR, PROTIME,  in the last 168 hours  No results found for this basename: DDIMER,  in the last 72 hours  Cardiac Enzymes No results found for this basename: CK, CKMB, TROPONINI, MYOGLOBIN,  in the last 168 hours ------------------------------------------------------------------------------------------------------------------ No components found with this basename: POCBNP,

## 2012-08-02 ENCOUNTER — Inpatient Hospital Stay (HOSPITAL_COMMUNITY): Payer: Medicare Other

## 2012-08-02 DIAGNOSIS — R7309 Other abnormal glucose: Secondary | ICD-10-CM

## 2012-08-02 LAB — URINE CULTURE: Colony Count: >=100000

## 2012-08-02 LAB — BASIC METABOLIC PANEL
BUN: 13 mg/dL (ref 6–23)
Chloride: 95 mEq/L — ABNORMAL LOW (ref 96–112)
Glucose, Bld: 122 mg/dL — ABNORMAL HIGH (ref 70–99)
Potassium: 3.2 mEq/L — ABNORMAL LOW (ref 3.5–5.1)

## 2012-08-02 LAB — GLUCOSE, CAPILLARY
Glucose-Capillary: 116 mg/dL — ABNORMAL HIGH (ref 70–99)
Glucose-Capillary: 142 mg/dL — ABNORMAL HIGH (ref 70–99)
Glucose-Capillary: 199 mg/dL — ABNORMAL HIGH (ref 70–99)
Glucose-Capillary: 164 mg/dL — ABNORMAL HIGH (ref 70–99)

## 2012-08-02 MED ORDER — SILVER SULFADIAZINE 1 % EX CREA
TOPICAL_CREAM | Freq: Every day | CUTANEOUS | Status: DC
  Administered 2012-08-02 – 2012-08-03 (×2): via TOPICAL
  Filled 2012-08-02: qty 50

## 2012-08-02 MED ORDER — SILVER SULFADIAZINE 1 % EX CREA
TOPICAL_CREAM | Freq: Every day | CUTANEOUS | Status: DC
  Filled 2012-08-02: qty 85

## 2012-08-02 MED ORDER — SILVER SULFADIAZINE 1 % EX CREA: TOPICAL_CREAM | Freq: Every day | CUTANEOUS | Status: DC

## 2012-08-02 MED ORDER — POTASSIUM CHLORIDE CRYS ER 20 MEQ PO TBCR
40.0000 meq | EXTENDED_RELEASE_TABLET | Freq: Four times a day (QID) | ORAL | Status: AC
  Administered 2012-08-02 (×2): 40 meq via ORAL
  Filled 2012-08-02 (×2): qty 2

## 2012-08-02 MED ORDER — SULFAMETHOXAZOLE-TMP DS 800-160 MG PO TABS
1.0000 | ORAL_TABLET | Freq: Two times a day (BID) | ORAL | Status: DC
  Administered 2012-08-02 – 2012-08-03 (×3): 1 via ORAL
  Filled 2012-08-02 (×4): qty 1

## 2012-08-02 NOTE — Evaluation (Signed)
Physical Therapy Evaluation Patient Details Name: Joshua Zavala MRN: [id number] DOB: 1945-10-22 Today's Date: 08/02/2012 Time: 5784-6962 PT Time Calculation (min): 9 min  PT Assessment / Plan / Recommendation Clinical Impression  Pt is a 67 year old male admitted for  left flank pain with fever and chills.  Pt would benefit from acute PT if remains in hospital in order to improve safety and independence with transfers and ambulation to prepare for d/c home with spouse.    PT Assessment  Patient needs continued PT services    Follow Up Recommendations  Home health PT    Does the patient have the potential to tolerate intense rehabilitation      Barriers to Discharge        Equipment Recommendations  Rolling walker with 5" wheels    Recommendations for Other Services     Frequency Min 3X/week    Precautions / Restrictions Precautions Precautions: Fall   Pertinent Vitals/Pain Reports L thigh pain with ambulation     Mobility  Bed Mobility Bed Mobility: Not assessed Transfers Transfers: Sit to Stand;Stand to Sit Sit to Stand: 4: Min guard;With upper extremity assist;From chair/3-in-1 Stand to Sit: 4: Min guard;With upper extremity assist;To chair/3-in-1 Details for Transfer Assistance: verbal cues for safe technique Ambulation/Gait Ambulation/Gait Assistance: 4: Min guard Ambulation Distance (Feet): 160 Feet Assistive device: Rolling walker Ambulation/Gait Assistance Details: pt reports pain in L thigh with ambulation, agreeable to use RW due to feeling weak however declines RW for home, verbal cues for safe use of RW Gait Pattern: Step-through pattern;Trunk flexed Gait velocity: decreased    Exercises     PT Diagnosis: Difficulty walking  PT Problem List: Decreased strength;Decreased activity tolerance;Decreased mobility;Decreased knowledge of use of DME PT Treatment Interventions: DME instruction;Gait training;Functional mobility training;Therapeutic  activities;Therapeutic exercise;Patient/family education   PT Goals Acute Rehab PT Goals PT Goal Formulation: With patient Time For Goal Achievement: 08/09/12 Potential to Achieve Goals: Good PT Goal: Supine/Side to Sit - Progress: Goal set today Pt will go Sit to Stand: with modified independence PT Goal: Sit to Stand - Progress: Goal set today Pt will go Stand to Sit: with modified independence PT Goal: Stand to Sit - Progress: Goal set today Pt will Ambulate: 51 - 150 feet;with modified independence;with least restrictive assistive device PT Goal: Ambulate - Progress: Goal set today  Visit Information  Last PT Received On: 08/02/12 Assistance Needed: +1    Subjective Data  Subjective: pt states he sometiimes uses quad cane at home   Prior Functioning  Home Living Lives With: Spouse Type of Home: House Home Access: Level entry Home Layout: One level Home Adaptive Equipment: Quad cane Prior Function Level of Independence: Independent with assistive device(s) Communication Communication: No difficulties    Cognition  Cognition Arousal/Alertness: Awake/alert Behavior During Therapy: WFL for tasks assessed/performed    Extremity/Trunk Assessment Right Lower Extremity Assessment RLE ROM/Strength/Tone: Deficits RLE ROM/Strength/Tone Deficits: grossly 4-/5 throughout Left Lower Extremity Assessment LLE ROM/Strength/Tone: Deficits LLE ROM/Strength/Tone Deficits: grossly 4-/5 throughout   Balance    End of Session PT - End of Session Equipment Utilized During Treatment: Gait belt Activity Tolerance: Patient limited by fatigue Patient left: in chair;with call bell/phone within reach;with chair alarm set  GP     Daman Steffenhagen,KATHrine E 08/02/2012, 10:32 AM Zenovia Jarred, PT, DPT 08/02/2012 Pager: [phone]

## 2012-08-02 NOTE — Progress Notes (Signed)
Pt attempted to pull up insulin, unable to see d/t " left my glasses at home", needed prompting to clean insulin vial top, almost stuck needle into thumb when trying to self inject. Will continue teaching while pt is here, wife at bedside.

## 2012-08-02 NOTE — Progress Notes (Signed)
Patient refused MRI after staff took him down. Refused to take pain meds with diet soda or water, would only take meds with regular coke.

## 2012-08-02 NOTE — Progress Notes (Signed)
Patient states he doesn't want to watch diabetic videos, wife states he has had DM for years and knows all that. Patient states he has CBG machine at home, can check blood sugars and can draw up and inject insulin. Patient agreeable to demonstrating this at next CBG time.

## 2012-08-02 NOTE — Progress Notes (Addendum)
[facility]                                                                                Patient Demographics  Joshua Zavala, is a 67 y.o. male, DOB - 1945-07-14, GEX:[id number], WNU:[id number]  Admit date - 07/30/2012  Admitting Physician Eduard Clos, MD  Outpatient Primary MD for the patient is No PCP Per Patient  LOS - 3   Chief Complaint  Patient presents with  . Back Pain        Assessment & Plan    1. L.Flank Pain due to L.  Pyelonephritis - already improved on empiric Rocephin which will be continued, will follow urine cultures, blood cultures have been ordered if temperature is over 100. Will need outpatient followup for repeat UA as > 300 protein was noted on UA. Also outpatient followup with urology post discharge for bladder thickening.   2. Hyponatremia likely due to dehydration, improved with IV fluids which will be continued.   3. DM type II. On sliding scale insulin, will add low-dose long-acting insulin, Glucophage held, check A1c follow CBGs. DM and Insulin education.   Lab Results  Component Value Date   HGBA1C 11.4* 07/31/2012    CBG (last 3)   Recent Labs  08/01/12 1705 08/01/12 2208 08/02/12 0832  GLUCAP 115* 154* 116*    4. Patient says that his L-spine pain has gotten worse, although on exam there is no point tenderness in the spine area pain is more around the left sacroiliac joints, he said he has had surgeries done to his L-spine about 10 years ago, will check MRI of L-spine. Of note he is am admitting in the hallway without much assistance, denies in radiating down his left leg. No weakness no bowel or bladder incontinence.    5. Left toe injury with small superficial abscess. Patient is unsure how long this has been present, he initially said it could have been there for months, we'll check an x-ray of the left toe. Place him on Bactrim, Dr. Lajoyce Corners requested to see the patient.   6. Low potassium. Replace recheck  in the morning.      Code Status:Full  Family Communication:   Disposition Plan:Home   Procedures CT Abd-Pelvis   Consults      DVT Prophylaxis  Lovenox   Lab Results  Component Value Date   PLT 211 08/01/2012    Medications  Scheduled Meds: . cefTRIAXone (ROCEPHIN)  IV  1 g Intravenous Q24H  . enoxaparin (LOVENOX) injection  40 mg Subcutaneous Q24H  . insulin aspart  0-9 Units Subcutaneous TID WC  . insulin glargine  20 Units Subcutaneous Daily  . lisinopril  2.5 mg Oral Daily  . potassium chloride  40 mEq Oral Q6H  . sodium chloride  3 mL Intravenous Q12H  . sulfamethoxazole-trimethoprim  1 tablet Oral Q12H   Continuous Infusions:   PRN Meds:.acetaminophen, acetaminophen, HYDROcodone-acetaminophen, ondansetron (ZOFRAN) IV, ondansetron  Antibiotics    Anti-infectives   Start     Dose/Rate Route Frequency Ordered Stop   08/02/12 1145  sulfamethoxazole-trimethoprim (BACTRIM DS) 800-160 MG per tablet 1 tablet     1 tablet Oral Every 12  hours 08/02/12 1140     08/01/12 0200  cefTRIAXone (ROCEPHIN) 1 g in dextrose 5 % 50 mL IVPB     1 g 100 mL/hr over 30 Minutes Intravenous Every 24 hours 07/31/12 0551     07/31/12 0345  cefTRIAXone (ROCEPHIN) 1 g in dextrose 5 % 50 mL IVPB     1 g 100 mL/hr over 30 Minutes Intravenous  Once 07/31/12 0331 07/31/12 0444       Time Spent in minutes  35   Noelia Lenart K M.D on 08/02/2012 at 11:41 AM  Between 7am to 7pm - Pager - [phone]  After 7pm go to [email or web address] - password [email or web address]  And look for the night coverage person covering for me after hours  [facility] Office  [phone]    Subjective:   Asad Keeven today has, No headache, No chest pain, No abdominal pain - No Nausea, No new weakness tingling or numbness, No Cough - SOB. Improved L.Flank pain. Is unsure how long he has had the swelling and small abscess on the left toe, says could be there for months.  Objective:   Filed Vitals:    08/01/12 0540 08/01/12 1437 08/01/12 2210 08/02/12 0525  BP: 131/60 149/64 150/69 158/77  Pulse: 91 96 93 88  Temp: 98.6 F (37 C) 98.9 F (37.2 C) 99.2 F (37.3 C) 98.6 F (37 C)  TempSrc: Oral Oral Oral Oral  Resp: 18  18 18   Height:      Weight: 92.9 kg (204 lb 12.9 oz)   92.8 kg (204 lb 9.4 oz)  SpO2: 98% 98% 99% 99%    Wt Readings from Last 3 Encounters:  08/02/12 92.8 kg (204 lb 9.4 oz)     Intake/Output Summary (Last 24 hours) at 08/02/12 1141 Last data filed at 08/02/12 0900  Gross per 24 hour  Intake   2394 ml  Output   1125 ml  Net   1269 ml    Exam Awake Alert, Oriented X 3, No new F.N deficits, Normal affect [place].AT,PERRAL Supple Neck,No JVD, No cervical lymphadenopathy appriciated.  Symmetrical Chest wall movement, Good air movement bilaterally, CTAB RRR,No Gallops,Rubs or new Murmurs, No Parasternal Heave +ve B.Sounds, Abd Soft, Non tender, No organomegaly appriciated, No rebound - guarding or rigidity. Mild L flank tenderness No Cyanosis, Clubbing or edema, No new Rash or bruise  L 1st toe small superficial abscess, also on the medial aspect there is a small bruise, left was mildly swollen.  Data Review   Micro Results Recent Results (from the past 240 hour(s))  URINE CULTURE     Status: None   Collection Time    07/31/12  1:51 AM      Result Value Range Status   Specimen Description URINE, CLEAN CATCH   Final   Special Requests NONE   Final   Culture  Setup Time 07/31/2012 22:00   Final   Colony Count >=100,000 COLONIES/ML   Final   Culture ESCHERICHIA COLI   Final   Report Status PENDING   Incomplete    Radiology Reports Ct Abdomen Pelvis Wo Contrast  07/31/2012  *RADIOLOGY REPORT*  Clinical Data: Bilateral flank pain and increased urinary frequency.  Red blood cells and white blood cells in the urine. Mild leukocytosis.  CT ABDOMEN AND PELVIS WITHOUT CONTRAST  Technique:  Multidetector CT imaging of the abdomen and pelvis was performed following  the standard protocol without intravenous contrast.  Comparison: Lumbar spine radiographs performed 09/15/2005  Findings: Mild bibasilar  atelectasis is noted, slightly nodular in appearance.  The liver and spleen are unremarkable in appearance.  The gallbladder is within normal limits.  The pancreas and adrenal glands are unremarkable.  Nonspecific perinephric stranding is noted bilaterally.  Evaluation for pyelonephritis is limited without contrast, but mild pyelonephritis cannot be excluded.  There is no evidence of hydronephrosis.  No renal or ureteral stones are seen.  No free fluid is identified.  The small bowel is unremarkable in appearance.  The stomach is within normal limits.  No acute vascular abnormalities are seen.  Scattered calcification is noted along the abdominal aorta and its branches.  The appendix is normal in caliber, without evidence for appendicitis.  The colon is unremarkable in appearance.  The bladder is mildly distended; mild apparent anterior bladder wall thickening is nonspecific.  Though this may reflect decompression, malignancy cannot be entirely excluded.  The prostate remains borderline normal in size, with scattered calcification.  No inguinal lymphadenopathy is seen.  No acute osseous abnormalities are identified.  Vacuum phenomenon is noted at L4-L5 and L5-S1.  IMPRESSION:  1.  No evidence of hydronephrosis; no renal or ureteral stones seen. 2.  Mild apparent anterior bladder wall thickening is nonspecific. Though this may reflect decompression, malignancy cannot be entirely excluded.  Cystoscopy would be helpful for further evaluation after treatment of the acute infectious process. 3.  Nonspecific perinephric stranding noted bilaterally. Evaluation for pyelonephritis is limited without contrast, but mild pyelonephritis cannot be excluded, given the patient's symptoms.   Original Report Authenticated By: Tonia Ghent, M.D.     [facility]  Recent Labs Lab 07/31/12 0225  07/31/12 0635 08/01/12 0414  WBC 12.2* 11.1* 10.3  HGB 10.9* 10.3* 9.6*  HCT 31.0* 29.8* 27.6*  PLT 217 196 211  MCV 69.5* 69.6* 70.1*  MCH 24.4* 24.1* 24.4*  MCHC 35.2 34.6 34.8  RDW 14.1 14.2 14.2  LYMPHSABS 0.9  --   --   MONOABS 0.9  --   --   EOSABS 0.0  --   --   BASOSABS 0.0  --   --     Chemistries   Recent Labs Lab 07/31/12 0225 07/31/12 0635 08/01/12 0414 08/02/12 0450  NA 129* 130* 134* 130*  K 3.7 3.6 3.5 3.2*  CL 91* 94* 98 95*  CO2 22 24 26 24   GLUCOSE 349* 268* 179* 122*  BUN 24* 24* 22 13  CREATININE 1.14 1.02 0.85 0.75  CALCIUM 8.6 8.1* 8.2* 8.4  AST 35  --   --   --   ALT 32  --   --   --   ALKPHOS 81  --   --   --   BILITOT 1.1  --   --   --    ------------------------------------------------------------------------------------------------------------------ estimated creatinine clearance is 102.9 ml/min (by C-G formula based on Cr of 0.75). ------------------------------------------------------------------------------------------------------------------  Recent Labs  07/31/12 0635  HGBA1C 11.4*   ------------------------------------------------------------------------------------------------------------------ No results found for this basename: CHOL, HDL, LDLCALC, TRIG, CHOLHDL, LDLDIRECT,  in the last 72 hours ------------------------------------------------------------------------------------------------------------------ No results found for this basename: TSH, T4TOTAL, FREET3, T3FREE, THYROIDAB,  in the last 72 hours ------------------------------------------------------------------------------------------------------------------ No results found for this basename: VITAMINB12, FOLATE, FERRITIN, TIBC, IRON, RETICCTPCT,  in the last 72 hours  Coagulation profile No results found for this basename: INR, PROTIME,  in the last 168 hours  No results found for this basename: DDIMER,  in the last 72 hours  Cardiac Enzymes No results found for  this basename: CK, CKMB, TROPONINI,  MYOGLOBIN,  in the last 168 hours ------------------------------------------------------------------------------------------------------------------ No components found with this basename: POCBNP,

## 2012-08-02 NOTE — Consult Note (Signed)
Reason for Consult:abscess ulcer left great toe  Referring Physician: Dr Eligah East is an 67 y.o. male.  HPI: Patient is a 67 year old gentleman with diabetes who presented with lower back pain. During his hospital stay patient was identified to have an abscess ulcer to the left great toe. Patient is not sure how long this has been present. He has not received previous care for this.  Past Medical History  Diagnosis Date  . Diabetes mellitus without complication     Past Surgical History  Procedure Laterality Date  . Back surgery    . Hemorrhoid surgery      Family History  Problem Relation Age of Onset  . Other Neg Hx     Social History:  reports that he has quit smoking. He does not have any smokeless tobacco history on file. He reports that he does not drink alcohol or use illicit drugs.  Allergies: No Known Allergies  Medications: I have reviewed the patient's current medications.  Results for orders placed during the hospital encounter of 07/30/12 (from the past 48 hour(s))  GLUCOSE, CAPILLARY     Status: Abnormal   Collection Time    07/31/12  9:44 PM      Result Value Range   Glucose-Capillary 191 (*) 70 - 99 mg/dL  CBC     Status: Abnormal   Collection Time    08/01/12  4:14 AM      Result Value Range   WBC 10.3  4.0 - 10.5 K/uL   RBC 3.94 (*) 4.22 - 5.81 MIL/uL   Hemoglobin 9.6 (*) 13.0 - 17.0 g/dL   HCT 13.2 (*) 44.0 - 10.2 %   MCV 70.1 (*) 78.0 - 100.0 fL   MCH 24.4 (*) 26.0 - 34.0 pg   MCHC 34.8  30.0 - 36.0 g/dL   RDW 72.5  36.6 - 44.0 %   Platelets 211  150 - 400 K/uL  BASIC METABOLIC PANEL     Status: Abnormal   Collection Time    08/01/12  4:14 AM      Result Value Range   Sodium 134 (*) 135 - 145 mEq/L   Potassium 3.5  3.5 - 5.1 mEq/L   Chloride 98  96 - 112 mEq/L   CO2 26  19 - 32 mEq/L   Glucose, Bld 179 (*) 70 - 99 mg/dL   BUN 22  6 - 23 mg/dL   Creatinine, Ser 3.47  0.50 - 1.35 mg/dL   Calcium 8.2 (*) 8.4 - 10.5 mg/dL   GFR  calc non Af Amer 88 (*) >90 mL/min   GFR calc Af Amer >90  >90 mL/min   Comment:            The eGFR has been calculated     using the CKD EPI equation.     This calculation has not been     validated in all clinical     situations.     eGFR's persistently     <90 mL/min signify     possible Chronic Kidney Disease.  GLUCOSE, CAPILLARY     Status: Abnormal   Collection Time    08/01/12  7:44 AM      Result Value Range   Glucose-Capillary 190 (*) 70 - 99 mg/dL  GLUCOSE, CAPILLARY     Status: Abnormal   Collection Time    08/01/12 12:14 PM      Result Value Range   Glucose-Capillary 122 (*) 70 - 99  mg/dL  GLUCOSE, CAPILLARY     Status: Abnormal   Collection Time    08/01/12  5:05 PM      Result Value Range   Glucose-Capillary 115 (*) 70 - 99 mg/dL  GLUCOSE, CAPILLARY     Status: Abnormal   Collection Time    08/01/12 10:08 PM      Result Value Range   Glucose-Capillary 154 (*) 70 - 99 mg/dL  BASIC METABOLIC PANEL     Status: Abnormal   Collection Time    08/02/12  4:50 AM      Result Value Range   Sodium 130 (*) 135 - 145 mEq/L   Potassium 3.2 (*) 3.5 - 5.1 mEq/L   Chloride 95 (*) 96 - 112 mEq/L   CO2 24  19 - 32 mEq/L   Glucose, Bld 122 (*) 70 - 99 mg/dL   BUN 13  6 - 23 mg/dL   Creatinine, Ser 1.61  0.50 - 1.35 mg/dL   Calcium 8.4  8.4 - 09.6 mg/dL   GFR calc non Af Amer >90  >90 mL/min   GFR calc Af Amer >90  >90 mL/min   Comment:            The eGFR has been calculated     using the CKD EPI equation.     This calculation has not been     validated in all clinical     situations.     eGFR's persistently     <90 mL/min signify     possible Chronic Kidney Disease.  GLUCOSE, CAPILLARY     Status: Abnormal   Collection Time    08/02/12  8:32 AM      Result Value Range   Glucose-Capillary 116 (*) 70 - 99 mg/dL  GLUCOSE, CAPILLARY     Status: Abnormal   Collection Time    08/02/12 11:58 AM      Result Value Range   Glucose-Capillary 142 (*) 70 - 99 mg/dL   GLUCOSE, CAPILLARY     Status: Abnormal   Collection Time    08/02/12  4:37 PM      Result Value Range   Glucose-Capillary 199 (*) 70 - 99 mg/dL    Dg Foot 2 Views Left  08/02/2012  *RADIOLOGY REPORT*  Clinical Data: Wound of the great toe in a diabetic patient.  LEFT FOOT - 2 VIEW  Comparison: None.  Findings: Soft tissues of the foot and great toe in particular are swollen.  No bony destructive change or soft tissue gas collection is identified.  There is no fracture.  Small plantar calcaneal spur and mild enthesopathic change at the Achilles tendon insertion noted.  IMPRESSION: Soft tissue swelling without plain film evidence of osteomyelitis.   Original Report Authenticated By: Holley Dexter, M.D.     Review of Systems  All other systems reviewed and are negative.   Blood pressure 129/75, pulse 87, temperature 98.7 F (37.1 C), temperature source Oral, resp. rate 17, height 5\' 11"  (1.803 m), weight 92.8 kg (204 lb 9.4 oz), SpO2 98.00%. Physical Exam On examination patient's both lower extremities he has no venous stasis ulcers there is no plantar ulcers in either leg. Examination of the feet he has a good dorsalis pedis and posterior tibial pulse on the left. He has a large ulcer which is approximately 4 cm in diameter over the plantar medial aspect of the left great toe. There is no a sending cellulitis.  After informed consent a suture  tray was used with scissors and pickups to remove the skin soft tissue there was an abscess deep to the superficial tissue layer. After debridement the wound was 4 cm in diameter approximately 3 mm deep. This did not probe down to bone. There was superficial epithelization through the majority of the wound. Assessment/Plan: Assessment: Abscess ulceration left great toe.  Plan: I feel this can be treated well as an outpatient with wound care. We will start Silvadene dressing changes in the hospital for discharge to home with daily Silvadene dressing  changes I will followup in the office in 2 weeks. I do not feel that he needs any antibiotics for treatment of this wound.  Jedrick Hutcherson V 08/02/2012, 6:05 PM

## 2012-08-02 NOTE — Progress Notes (Signed)
Stopped in for brief visit with patent and his family today. Introduced myself and explained Spiritual Care services. Asked if he had any concerns. He voiced none. Offered friendship and encouragement and expressed hope that he would feel better soon. He indicated that he was feeling better.

## 2012-08-03 LAB — GLUCOSE, CAPILLARY
Glucose-Capillary: 131 mg/dL — ABNORMAL HIGH (ref 70–99)
Glucose-Capillary: 119 mg/dL — ABNORMAL HIGH (ref 70–99)

## 2012-08-03 LAB — BASIC METABOLIC PANEL
Chloride: 97 mEq/L (ref 96–112)
GFR calc Af Amer: >90 mL/min (ref >90)
Potassium: 3.7 mEq/L (ref 3.5–5.1)

## 2012-08-03 MED ORDER — LISINOPRIL 2.5 MG PO TABS: 2.5000 mg | ORAL_TABLET | Freq: Every day | ORAL | Status: AC

## 2012-08-03 MED ORDER — INSULIN GLARGINE 100 UNIT/ML ~~LOC~~ SOLN: 20.0000 [IU] | Freq: Every day | SUBCUTANEOUS | Status: AC

## 2012-08-03 MED ORDER — FREESTYLE SYSTEM KIT: 1.0000 | PACK | Freq: Three times a day (TID) | Status: AC

## 2012-08-03 MED ORDER — HYDROCODONE-ACETAMINOPHEN 5-325 MG PO TABS: 1.0000 | ORAL_TABLET | ORAL | Status: AC | PRN

## 2012-08-03 MED ORDER — LEVOFLOXACIN 500 MG PO TABS: 500.0000 mg | ORAL_TABLET | Freq: Every day | ORAL | Status: AC

## 2012-08-03 MED ORDER — INSULIN ASPART 100 UNIT/ML ~~LOC~~ SOLN: SUBCUTANEOUS | Status: AC

## 2012-08-03 MED ORDER — SILVER SULFADIAZINE 1 % EX CREA: TOPICAL_CREAM | Freq: Every day | CUTANEOUS | Status: AC

## 2012-08-03 NOTE — Progress Notes (Signed)
CARE MANAGEMENT NOTE 08/03/2012  Patient:  Joshua Zavala, Joshua Zavala   Account Number:  [id number]  Date Initiated:  08/03/2012  Documentation initiated by:  Zavala,RHONDA  Subjective/Objective Assessment:   discharge note: pt going home with rn ,pt and walker     Action/Plan:   hhc   Anticipated DC Date:  08/03/2012   Anticipated DC Plan:  HOME W HOME HEALTH SERVICES  In-house referral  NA      DC Planning Services  CM consult      PAC Choice  NA   Choice offered to / List presented to:  [name]   DME arranged  Joshua Zavala      DME agency  [facility]     HH arranged  HH-1 RN  HH-2 PT      [facility] agency  [facility]   Status of service:  Completed, signed off Medicare Important Message given?  NA - LOS <3 / Initial given by admissions (If response is "NO", the following Medicare IM given date fields will be blank) Date Medicare IM given:   Date Additional Medicare IM given:    Discharge Disposition:    Per UR Regulation:  Reviewed for med. necessity/level of care/duration of stay  If discussed at Long Length of Stay Meetings, dates discussed:    Comments:  Joshua Loser Davis,RN,BSN,CCM

## 2012-08-03 NOTE — Discharge Summary (Signed)
[facility]                                                                                   Wyndell Cardiff, is a 67 y.o. male  DOB 1945-09-13  MRN [id number].  Admission date:  07/30/2012  Discharge Date:  08/03/2012  Primary MD  No PCP Per Patient  Admitting Physician  Eduard Clos, MD  Admission Diagnosis  Pyelonephritis [590.80] Hyperglycemia [790.29] Diabetes mellitus [250.00]  Discharge Diagnosis     Principal Problem:   Pyelonephritis Active Problems:   Diabetes mellitus   Hyponatremia    Past Medical History  Diagnosis Date  . Diabetes mellitus without complication     Past Surgical History  Procedure Laterality Date  . Back surgery    . Hemorrhoid surgery       Recommendations for primary care physician for things to follow:     Discharge Diagnoses:   Principal Problem:   Pyelonephritis Active Problems:   Diabetes mellitus   Hyponatremia    Discharge Condition: stable   Diet recommendation: See Discharge Instructions below   Consults Orthopedics Dr Lajoyce Corners, DM education, PT, C Manager    History of present illness and  Hospital Course:     Kindly see H&P for history of present illness and admission details, please review complete Labs, Consult reports and Test reports for all details in brief Joshua Zavala, is a 67 y.o. male,  with history of diabetes mellitus, chronic back pain, noncompliance with physicians in medications, was admitted with left-sided flank pain with dysuria due to pyelonephritis, also was found to have left first toe abscess which was present for the last several weeks.  For his pyelonephritis urine culture results were noted consulted Dr. Orvan Falconer infectious disease over the phone, total of 14 days of treatment recommended, patient will be switched on Levaquin for 10 more days. Dose has been discussed with the pharmacist. He will follow with primary care physician at the [facility] for followup CBC BMP, wife  also updated.   For his diabetes mellitus type 2, A1c was 11.4, he has been placed on Lantus along with sliding scale, home dose Glucophage will be continued. Glucose testing supplies provided, diabetes education provided, insulin education provided, has been given written instructions on Accu-Cheks. Continue close followup with PCP.   Chronic back pain, stable, patient initially said that his pain was getting worse but then said it was chronic, MRI of the L-spine was offered which he refused, he chooses to follow with his primary physician for this problem at this time.   Left big toe superficial abscess which was noted on physical exam and 70, patient had no complaints, he said he had noticed a lesion on his left big toe a few weeks ago but since it was not bothering him never told anyone about it, Dr. Lajoyce Corners orthopedics was consulted who did a bedside I&D, psych has no surrounding cellulitis, no antibiotics were recommended by Dr. Lajoyce Corners at this time for this problem. Local wound care with home RN will be provided.     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  Repeat BMP in a week by PCP.     Today   Subjective:   Julius Matus today has no headache,no chest abdominal pain,no new weakness tingling or numbness, feels much better wants to go home today.    Objective:   Blood pressure 151/88, pulse 89, temperature 99 F (37.2 C), temperature source Oral, resp. rate 16, height 5\' 11"  (1.803 m), weight 96.934 kg (213 lb 11.2 oz), SpO2 99.00%.   Intake/Output Summary (Last 24 hours) at 08/03/12 0911 Last data filed at 08/03/12 0650  Gross per 24 hour  Intake    410 ml  Output    850 ml  Net   -440 ml    Exam Awake Alert, Oriented *3, No new F.N deficits, Normal affect [place].AT,PERRAL Supple Neck,No JVD, No cervical lymphadenopathy appriciated.  Symmetrical Chest wall movement, Good air movement bilaterally, CTAB RRR,No  Gallops,Rubs or new Murmurs, No Parasternal Heave +ve B.Sounds, Abd Soft, Non tender, No organomegaly appriciated, No rebound -guarding or rigidity. No Cyanosis, Clubbing or edema, No new Rash or bruise, R toe I&D site clean  Data Review   Major procedures and Radiology Reports - PLEASE review detailed and final reports for all details in brief -       Ct Abdomen Pelvis Wo Contrast  07/31/2012  *RADIOLOGY REPORT*  Clinical Data: Bilateral flank pain and increased urinary frequency.  Red blood cells and white blood cells in the urine. Mild leukocytosis.  CT ABDOMEN AND PELVIS WITHOUT CONTRAST  Technique:  Multidetector CT imaging of the abdomen and pelvis was performed following the standard protocol without intravenous contrast.  Comparison: Lumbar spine radiographs performed 09/15/2005  Findings: Mild bibasilar atelectasis is noted, slightly nodular in appearance.  The liver and spleen are unremarkable in appearance.  The gallbladder is within normal limits.  The pancreas and adrenal glands are unremarkable.  Nonspecific perinephric stranding is noted bilaterally.  Evaluation for pyelonephritis is limited without contrast, but mild pyelonephritis cannot be excluded.  There is no evidence of hydronephrosis.  No renal or ureteral stones are seen.  No free fluid is identified.  The small bowel is unremarkable in appearance.  The stomach is within normal limits.  No acute vascular abnormalities are seen.  Scattered calcification is noted along the abdominal aorta and its branches.  The appendix is normal in caliber, without evidence for appendicitis.  The colon is unremarkable in appearance.  The bladder is mildly distended; mild apparent anterior bladder wall thickening is nonspecific.  Though this may reflect decompression, malignancy cannot be entirely excluded.  The prostate remains borderline normal in size, with scattered calcification.  No inguinal lymphadenopathy is seen.  No acute osseous  abnormalities are identified.  Vacuum phenomenon is noted at L4-L5 and L5-S1.  IMPRESSION:  1.  No evidence of hydronephrosis; no renal or ureteral stones seen. 2.  Mild apparent anterior bladder wall thickening is nonspecific. Though this may reflect decompression, malignancy cannot be entirely excluded.  Cystoscopy would be helpful for further evaluation after treatment of the acute infectious process. 3.  Nonspecific perinephric stranding noted bilaterally. Evaluation for pyelonephritis is limited without contrast, but mild pyelonephritis cannot be excluded, given the patient's symptoms.   Original Report Authenticated By: Tonia Ghent, M.D.    Dg Foot 2 Views Left  08/02/2012  *RADIOLOGY REPORT*  Clinical Data: Wound of the great toe in a diabetic patient.  LEFT FOOT - 2 VIEW  Comparison: None.  Findings: Soft tissues of the foot and great toe in particular are  swollen.  No bony destructive change or soft tissue gas collection is identified.  There is no fracture.  Small plantar calcaneal spur and mild enthesopathic change at the Achilles tendon insertion noted.  IMPRESSION: Soft tissue swelling without plain film evidence of osteomyelitis.   Original Report Authenticated By: Holley Dexter, M.D.     Micro Results      Recent Results (from the past 240 hour(s))  URINE CULTURE     Status: None   Collection Time    07/31/12  1:51 AM      Result Value Range Status   Specimen Description URINE, CLEAN CATCH   Final   Special Requests NONE   Final   Culture  Setup Time 07/31/2012 22:00   Final   Colony Count >=100,000 COLONIES/ML   Final   Culture ESCHERICHIA COLI   Final   Report Status 08/02/2012 FINAL   Final   Organism ID, Bacteria ESCHERICHIA COLI   Final     CBC w Diff: Lab Results  Component Value Date   WBC 10.3 08/01/2012   HGB 9.6* 08/01/2012   HCT 27.6* 08/01/2012   PLT 211 08/01/2012   LYMPHOPCT 7* 07/31/2012   MONOPCT 7 07/31/2012   EOSPCT 0 07/31/2012   BASOPCT 0 07/31/2012     CMP: Lab Results  Component Value Date   NA 131* 08/03/2012   K 3.7 08/03/2012   CL 97 08/03/2012   CO2 25 08/03/2012   BUN 12 08/03/2012   CREATININE 0.75 08/03/2012   PROT 7.5 07/31/2012   ALBUMIN 3.1* 07/31/2012   BILITOT 1.1 07/31/2012   ALKPHOS 81 07/31/2012   AST 35 07/31/2012   ALT 32 07/31/2012  .   Discharge Instructions     Follow with Primary MD as suggested by case manager in 7 days   Get CBC, CMP, UA  checked 7 days by Primary MD and again as instructed by your Primary MD. Get a 2 view Chest X ray done next visit if you had Pneumonia of Lung problems at the Hospital.  Get Medicines reviewed and adjusted.  Accuchecks 4 times/day, Once in AM empty stomach and then before each meal. Log in all results and show them to your Prim.MD in 3 days. If any glucose reading is under 80 or above 300 call your Prim MD immidiately. Follow Low glucose instructions for glucose under 80 as instructed.   Please request your Prim.MD to go over all Hospital Tests and Procedure/Radiological results at the follow up, please get all Hospital records sent to your Prim MD by signing hospital release before you go home.  Activity: As tolerated with Full fall precautions use walker/cane & assistance as needed   Diet:  Heart healthy low carbohydrate, 1.5 L a day fluid restriction daily.  Check your Weight same time everyday, if you gain over 2 pounds, or you develop in leg swelling, experience more shortness of breath or chest pain, call your Primary MD immediately. Follow Cardiac Low Salt Diet and 1.5 lit/day fluid restriction.  Disposition Home   If you experience worsening of your admission symptoms, develop shortness of breath, life threatening emergency, suicidal or homicidal thoughts you must seek medical attention immediately by calling 911 or calling your MD immediately  if symptoms less severe.  You Must read complete instructions/literature along with all the possible adverse  reactions/side effects for all the Medicines you take and that have been prescribed to you. Take any new Medicines after you have completely understood and accpet  all the possible adverse reactions/side effects.   Do not drive and provide baby sitting services if your were admitted for syncope or siezures until you have seen by Primary MD or a Neurologist and advised to do so again.  Do not drive when taking Pain medications.    Do not take more than prescribed Pain, Sleep and Anxiety Medications  Special Instructions: If you have smoked or chewed Tobacco  in the last 2 yrs please stop smoking, stop any regular Alcohol  and or any Recreational drug use.  Wear Seat belts while driving.   Wound care.  Cleansed left foot ulcer with soap and water daily. Apply Silvadene to the wound plus 4 x 4 gauze plus an Ace wrap. Wear the postoperative shoe for her weightbearing. Minimize weightbearing.     Follow-up Information   Follow up with PCP per case manager.      Follow up with NESI,MARC-HENRY, MD. Schedule an appointment as soon as possible for a visit in 1 week.   Contact information:   [address] [place] [address] [phone]       Follow up with DUDA,MARCUS V, MD In 2 weeks.   Contact information:   [address] [place] [address] [phone]         Discharge Medications     Medication List    TAKE these medications       glucose monitoring kit monitoring kit  1 each by Does not apply route 4 (four) times daily - after meals and at bedtime. 1 month Diabetic Testing Supplies for QAC-QHS accuchecks.     HYDROcodone-acetaminophen 5-325 MG per tablet  Commonly known as:  NORCO/VICODIN  Take 1 tablet by mouth every 4 (four) hours as needed.     insulin aspart 100 UNIT/ML injection  Commonly known as:  novoLOG  Before each meal 3 times a day, 140-199 - 2 units, 200-250 - 4 units, 251-299 - 6 units,   300-349 - 8 units,  350 or above 10 units.  Insulin PEN if approved, provide syringes and needles if needed.     insulin glargine 100 UNIT/ML injection  Commonly known as:  LANTUS  Inject 0.2 mLs (20 Units total) into the skin daily. Dispense 10, syringes and needles if needed.     levofloxacin 500 MG tablet  Commonly known as:  LEVAQUIN  Take 1 tablet (500 mg total) by mouth daily.     lisinopril 2.5 MG tablet  Commonly known as:  PRINIVIL,ZESTRIL  Take 1 tablet (2.5 mg total) by mouth daily.     metFORMIN 500 MG tablet  Commonly known as:  GLUCOPHAGE  Take 500 mg by mouth 2 (two) times daily with a meal.     silver sulfADIAZINE 1 % cream  Commonly known as:  SILVADENE  Apply topically daily. Apply to left great toe ulcer daily           Total Time in preparing paper work, data evaluation and todays exam - 35 minutes  Leroy Sea M.D on 08/03/2012 at 9:11 AM  [facility] Office  [phone]

## 2012-08-23 ENCOUNTER — Ambulatory Visit: Payer: Medicare Other | Admitting: Dietician

## 2013-08-15 IMAGING — CR DG FOOT 2V*L*
1 series · 2 of 2 positions shown · non-contrast
Comparison: None.

CLINICAL DATA: Wound of the great toe in a diabetic patient.

LEFT FOOT - 2 VIEW

[Series 1: AP · left · 2 of 2 slices shown]
[im 1/2]
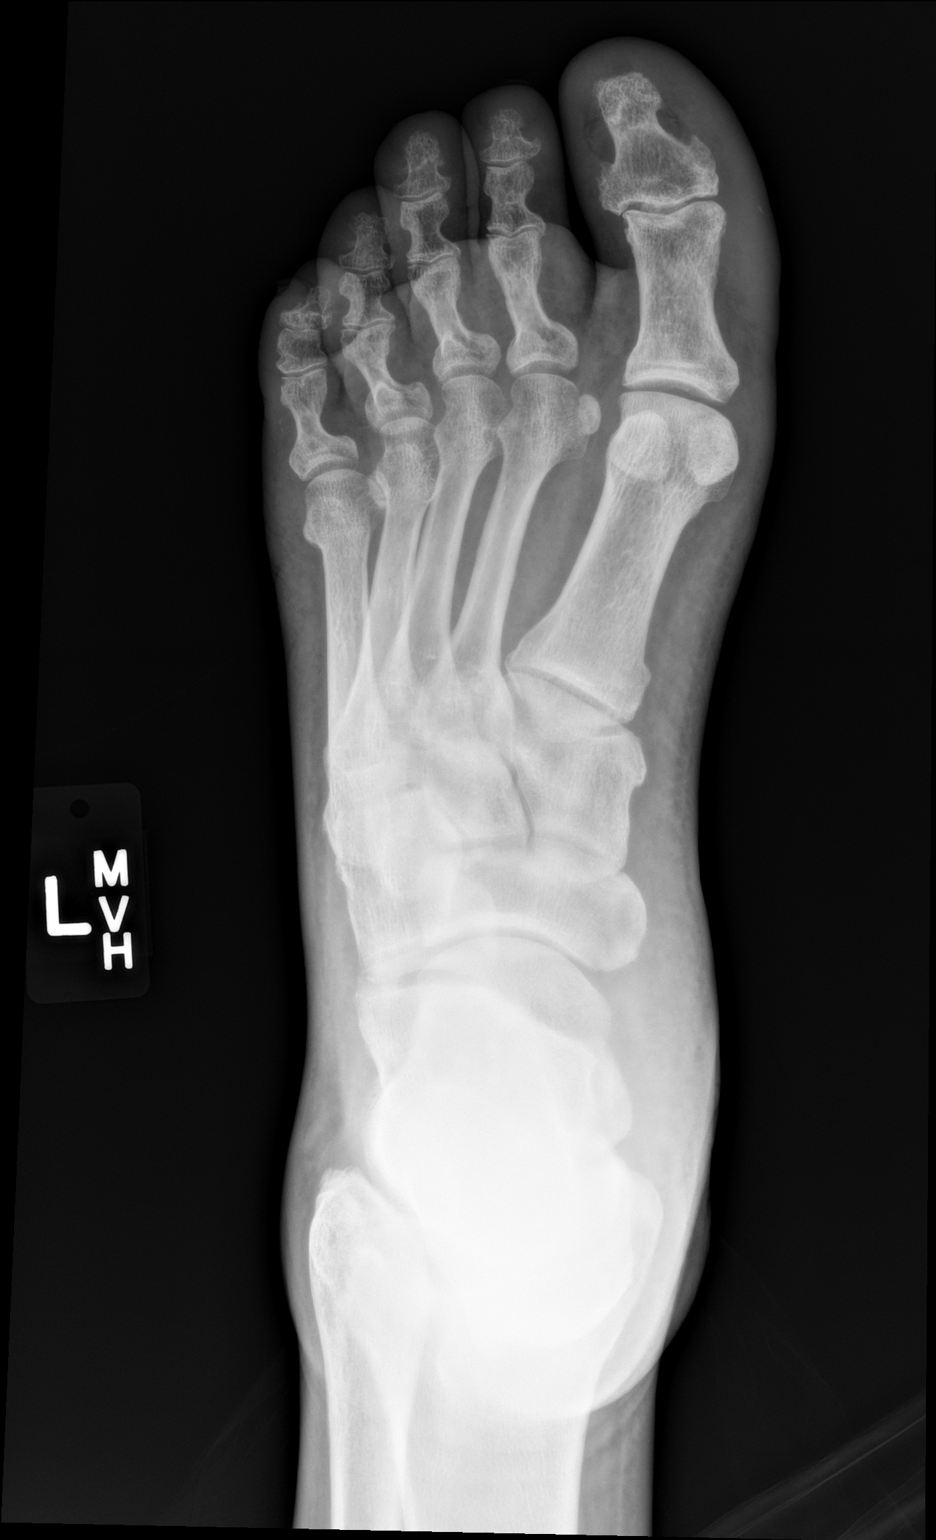
[im 2/2]
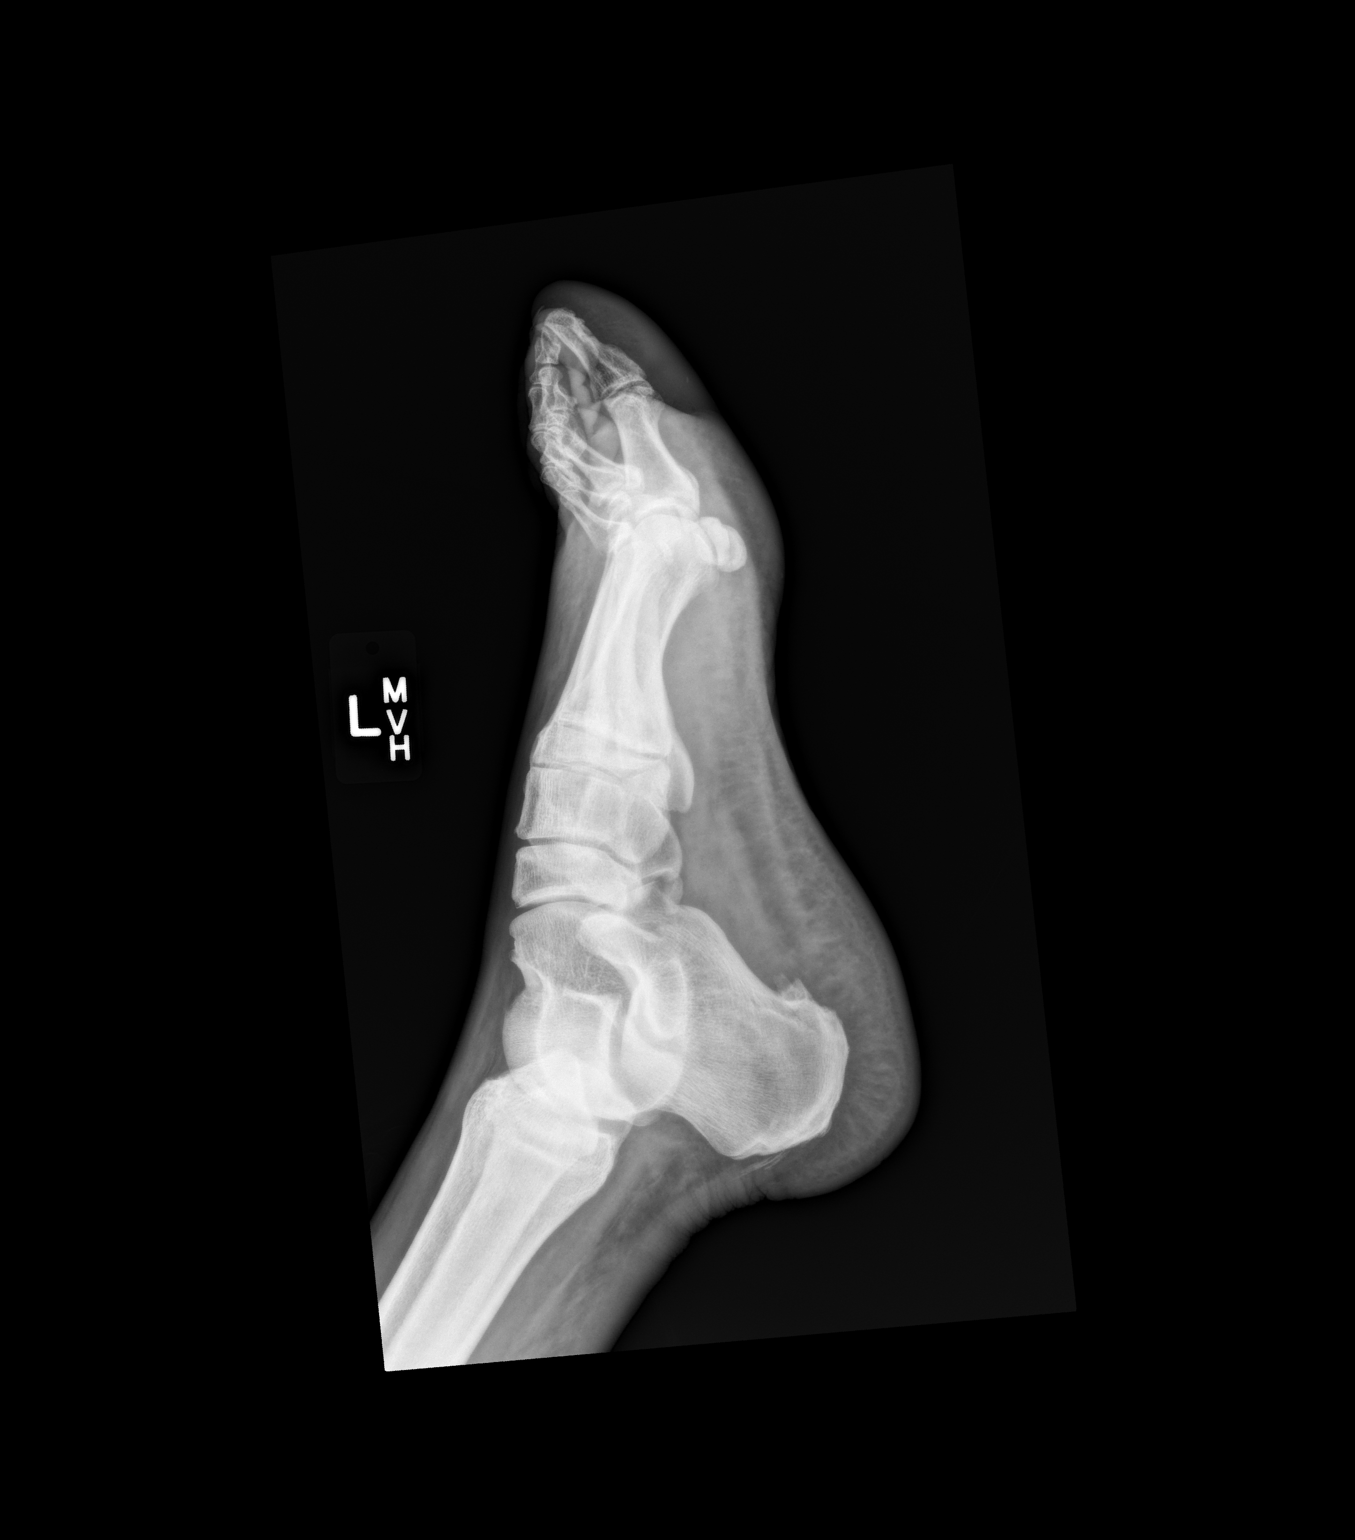

[2 of 2 positions shown; findings below may reference images not displayed]

FINDINGS: Soft tissues of the foot and great toe in particular are
swollen.  No bony destructive change or soft tissue gas collection
is identified.  There is no fracture.  Small plantar calcaneal spur
and mild enthesopathic change at the Achilles tendon insertion
noted.
IMPRESSION: Soft tissue swelling without plain film evidence of osteomyelitis.

## 2014-01-16 DEATH — deceased

## 2018-05-28 ENCOUNTER — Telehealth (INDEPENDENT_AMBULATORY_CARE_PROVIDER_SITE_OTHER): Payer: Self-pay | Admitting: Orthopedic Surgery

## 2018-05-28 NOTE — Telephone Encounter (Signed)
Patient's wife Joshua Zavala states she needs to talk with Dr Lajoyce Corners regarding a personal matter concerning her husband. Joshua Zavala did not want tell me the reason for the call. She states that it has nothing to do with an Orthopedics.

## 2018-05-28 NOTE — Telephone Encounter (Signed)
See below

## 2018-05-29 NOTE — Telephone Encounter (Signed)
I called and left message
